# Patient Record
Sex: Female | Born: 1945
Health system: Southern US, Community
[De-identification: ages and names within clinical notes are randomized; demographics above are authoritative.]

## PROBLEM LIST (undated history)

## (undated) DIAGNOSIS — G2581 Restless legs syndrome: Secondary | ICD-10-CM

## (undated) DIAGNOSIS — N289 Disorder of kidney and ureter, unspecified: Secondary | ICD-10-CM

## (undated) DIAGNOSIS — M810 Age-related osteoporosis without current pathological fracture: Secondary | ICD-10-CM

## (undated) DIAGNOSIS — K219 Gastro-esophageal reflux disease without esophagitis: Secondary | ICD-10-CM

## (undated) HISTORY — PX: HIP SURGERY: SHX245

---

## 2018-04-19 ENCOUNTER — Other Ambulatory Visit: Payer: Self-pay

## 2018-04-19 ENCOUNTER — Emergency Department (HOSPITAL_BASED_OUTPATIENT_CLINIC_OR_DEPARTMENT_OTHER): Payer: No Typology Code available for payment source

## 2018-04-19 ENCOUNTER — Emergency Department (HOSPITAL_BASED_OUTPATIENT_CLINIC_OR_DEPARTMENT_OTHER)
Admission: EM | Admit: 2018-04-19 | Discharge: 2018-04-19 | Disposition: A | Discharge status: Home / Self Care | Payer: No Typology Code available for payment source | Attending: Emergency Medicine | Admitting: Emergency Medicine

## 2018-04-19 ENCOUNTER — Encounter (HOSPITAL_BASED_OUTPATIENT_CLINIC_OR_DEPARTMENT_OTHER): Payer: Self-pay | Admitting: Emergency Medicine

## 2018-04-19 DIAGNOSIS — S2220XA Unspecified fracture of sternum, initial encounter for closed fracture: Secondary | ICD-10-CM | POA: Diagnosis not present

## 2018-04-19 DIAGNOSIS — Y939 Activity, unspecified: Secondary | ICD-10-CM | POA: Insufficient documentation

## 2018-04-19 DIAGNOSIS — Y929 Unspecified place or not applicable: Secondary | ICD-10-CM | POA: Diagnosis not present

## 2018-04-19 DIAGNOSIS — Y999 Unspecified external cause status: Secondary | ICD-10-CM | POA: Insufficient documentation

## 2018-04-19 DIAGNOSIS — S299XXA Unspecified injury of thorax, initial encounter: Secondary | ICD-10-CM | POA: Diagnosis present

## 2018-04-19 DIAGNOSIS — R0789 Other chest pain: Secondary | ICD-10-CM

## 2018-04-19 HISTORY — DX: Age-related osteoporosis without current pathological fracture: M81.0

## 2018-04-19 HISTORY — DX: Gastro-esophageal reflux disease without esophagitis: K21.9

## 2018-04-19 HISTORY — DX: Disorder of kidney and ureter, unspecified: N28.9

## 2018-04-19 HISTORY — DX: Restless legs syndrome: G25.81

## 2018-04-19 LAB — BASIC METABOLIC PANEL
ANION GAP: 9 (ref 5–15)
BUN: 26 mg/dL — ABNORMAL HIGH (ref 6–20)
CHLORIDE: 99 mmol/L — AB (ref 101–111)
CO2: 28 mmol/L (ref 22–32)
CREATININE: 1.21 mg/dL — AB (ref 0.44–1.00)
Calcium: 9.9 mg/dL (ref 8.9–10.3)
GFR calc non Af Amer: 44 mL/min — ABNORMAL LOW (ref >60)
GFR, EST AFRICAN AMERICAN: 51 mL/min — AB (ref >60)
Glucose, Bld: 99 mg/dL (ref 65–99)
Potassium: 3.4 mmol/L — ABNORMAL LOW (ref 3.5–5.1)
Sodium: 136 mmol/L (ref 135–145)

## 2018-04-19 LAB — CBC
HCT: 41 % (ref 36.0–46.0)
Hemoglobin: 13.5 g/dL (ref 12.0–15.0)
MCH: 31.8 pg (ref 26.0–34.0)
MCHC: 32.9 g/dL (ref 30.0–36.0)
MCV: 96.5 fL (ref 78.0–100.0)
PLATELETS: 351 10*3/uL (ref 150–400)
RBC: 4.25 MIL/uL (ref 3.87–5.11)
RDW: 12.5 % (ref 11.5–15.5)
WBC: 6.3 10*3/uL (ref 4.0–10.5)

## 2018-04-19 LAB — TROPONIN I

## 2018-04-19 MED ORDER — HYDROCODONE-ACETAMINOPHEN 5-325 MG PO TABS: 1.0000 | ORAL_TABLET | Freq: Four times a day (QID) | ORAL | 0 refills | Status: AC | PRN

## 2018-04-19 MED ORDER — HYDROCODONE-ACETAMINOPHEN 5-325 MG PO TABS
1.0000 | ORAL_TABLET | Freq: Once | ORAL | Status: AC
  Administered 2018-04-19: 1 via ORAL
  Filled 2018-04-19: qty 1

## 2018-04-19 MED FILL — HYDROCODON-APAP 5-325: 5-325 | 3 days supply | Qty: 12 | Fill #0
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
## 2025-01-15 DEATH — deceased
